# Patient Record
Sex: Female | Born: 1935 | Race: White | Hispanic: No
Health system: Southern US, Community
[De-identification: ages and names within clinical notes are randomized; demographics above are authoritative.]

---

## 2004-09-23 ENCOUNTER — Ambulatory Visit: Payer: Self-pay | Admitting: General Surgery

## 2004-12-07 ENCOUNTER — Inpatient Hospital Stay: Payer: Self-pay | Admitting: Cardiology

## 2004-12-07 ENCOUNTER — Other Ambulatory Visit: Payer: Self-pay

## 2004-12-08 ENCOUNTER — Other Ambulatory Visit: Payer: Self-pay

## 2004-12-18 ENCOUNTER — Encounter: Payer: Self-pay | Admitting: Internal Medicine

## 2004-12-23 ENCOUNTER — Ambulatory Visit: Payer: Self-pay

## 2004-12-26 ENCOUNTER — Inpatient Hospital Stay: Payer: Self-pay | Admitting: Internal Medicine

## 2004-12-26 ENCOUNTER — Other Ambulatory Visit: Payer: Self-pay

## 2004-12-31 ENCOUNTER — Encounter: Payer: Self-pay | Admitting: Internal Medicine

## 2005-03-17 ENCOUNTER — Ambulatory Visit: Payer: Self-pay | Admitting: Internal Medicine

## 2005-04-29 ENCOUNTER — Emergency Department: Payer: Self-pay | Admitting: Emergency Medicine

## 2005-05-06 ENCOUNTER — Ambulatory Visit: Payer: Self-pay | Admitting: Internal Medicine

## 2005-06-01 ENCOUNTER — Ambulatory Visit: Payer: Self-pay | Admitting: Urology

## 2005-09-28 ENCOUNTER — Ambulatory Visit: Payer: Self-pay | Admitting: General Surgery

## 2005-12-14 ENCOUNTER — Ambulatory Visit: Payer: Self-pay | Admitting: Cardiovascular Disease

## 2006-02-13 ENCOUNTER — Emergency Department: Payer: Self-pay | Admitting: Emergency Medicine

## 2006-02-13 ENCOUNTER — Other Ambulatory Visit: Payer: Self-pay

## 2006-09-29 ENCOUNTER — Ambulatory Visit: Payer: Self-pay | Admitting: General Surgery

## 2007-03-01 ENCOUNTER — Ambulatory Visit: Payer: Self-pay | Admitting: Nephrology

## 2007-11-08 ENCOUNTER — Ambulatory Visit: Payer: Self-pay | Admitting: General Surgery

## 2007-11-12 ENCOUNTER — Emergency Department: Payer: Self-pay | Admitting: Emergency Medicine

## 2007-11-20 ENCOUNTER — Ambulatory Visit: Payer: Self-pay | Admitting: General Surgery

## 2007-11-22 ENCOUNTER — Encounter: Payer: Self-pay | Admitting: Internal Medicine

## 2007-11-24 ENCOUNTER — Encounter: Payer: Self-pay | Admitting: Internal Medicine

## 2007-12-06 ENCOUNTER — Ambulatory Visit: Payer: Self-pay | Admitting: Rheumatology

## 2007-12-25 ENCOUNTER — Encounter: Payer: Self-pay | Admitting: Internal Medicine

## 2008-01-24 ENCOUNTER — Encounter: Payer: Self-pay | Admitting: Internal Medicine

## 2008-05-09 ENCOUNTER — Ambulatory Visit: Payer: Self-pay | Admitting: Gastroenterology

## 2008-11-25 ENCOUNTER — Ambulatory Visit: Payer: Self-pay | Admitting: General Surgery

## 2009-12-01 ENCOUNTER — Ambulatory Visit: Payer: Self-pay | Admitting: General Surgery

## 2010-12-03 ENCOUNTER — Ambulatory Visit: Payer: Self-pay | Admitting: General Surgery

## 2011-05-14 ENCOUNTER — Ambulatory Visit: Payer: Self-pay | Admitting: Ophthalmology

## 2011-12-21 ENCOUNTER — Ambulatory Visit: Payer: Self-pay | Admitting: Internal Medicine

## 2013-09-23 ENCOUNTER — Ambulatory Visit: Payer: Self-pay | Admitting: Hematology and Oncology

## 2013-10-04 ENCOUNTER — Emergency Department: Payer: Self-pay | Admitting: Emergency Medicine

## 2013-10-04 LAB — COMPREHENSIVE METABOLIC PANEL
ALK PHOS: 64 U/L
Albumin: 2.5 g/dL — ABNORMAL LOW (ref 3.4–5.0)
Anion Gap: 6 — ABNORMAL LOW (ref 7–16)
BUN: 14 mg/dL (ref 7–18)
Bilirubin,Total: 0.5 mg/dL (ref 0.2–1.0)
CHLORIDE: 105 mmol/L (ref 98–107)
CO2: 26 mmol/L (ref 21–32)
Calcium, Total: 8.5 mg/dL (ref 8.5–10.1)
Creatinine: 1.2 mg/dL (ref 0.60–1.30)
EGFR (African American): 49 — ABNORMAL LOW
GFR CALC NON AF AMER: 43 — AB
Glucose: 93 mg/dL (ref 65–99)
Osmolality: 274 (ref 275–301)
POTASSIUM: 3.9 mmol/L (ref 3.5–5.1)
SGOT(AST): 26 U/L (ref 15–37)
SGPT (ALT): 17 U/L (ref 12–78)
SODIUM: 137 mmol/L (ref 136–145)
Total Protein: 7.1 g/dL (ref 6.4–8.2)

## 2013-10-04 LAB — RAPID INFLUENZA A&B ANTIGENS (ARMC ONLY)

## 2013-10-04 LAB — URINALYSIS, COMPLETE
Bacteria: NONE SEEN
Bilirubin,UR: NEGATIVE
GLUCOSE, UR: NEGATIVE mg/dL (ref 0–75)
KETONE: NEGATIVE
LEUKOCYTE ESTERASE: NEGATIVE
NITRITE: NEGATIVE
PROTEIN: NEGATIVE
Ph: 6 (ref 4.5–8.0)
SPECIFIC GRAVITY: 1.009 (ref 1.003–1.030)
SQUAMOUS EPITHELIAL: NONE SEEN
WBC UR: <1 /HPF (ref 0–5)

## 2013-10-04 LAB — CBC
HCT: 36.7 % (ref 35.0–47.0)
HGB: 12.3 g/dL (ref 12.0–16.0)
MCH: 41 pg — ABNORMAL HIGH (ref 26.0–34.0)
MCHC: 33.6 g/dL (ref 32.0–36.0)
MCV: 122 fL — ABNORMAL HIGH (ref 80–100)
Platelet: 229 10*3/uL (ref 150–440)
RBC: 3.01 10*6/uL — AB (ref 3.80–5.20)
RDW: 16.5 % — ABNORMAL HIGH (ref 11.5–14.5)
WBC: 15.2 10*3/uL — ABNORMAL HIGH (ref 3.6–11.0)

## 2013-10-04 LAB — TROPONIN I

## 2013-10-08 ENCOUNTER — Ambulatory Visit: Payer: Self-pay | Admitting: Hematology and Oncology

## 2013-10-10 ENCOUNTER — Ambulatory Visit: Payer: Self-pay | Admitting: Hematology and Oncology

## 2013-10-18 ENCOUNTER — Ambulatory Visit: Payer: Self-pay | Admitting: Hematology and Oncology

## 2013-10-18 LAB — CBC WITH DIFFERENTIAL/PLATELET
BASOS PCT: 0.2 %
Basophil #: 0 10*3/uL (ref 0.0–0.1)
Eosinophil #: 0.3 10*3/uL (ref 0.0–0.7)
Eosinophil %: 2.7 %
HCT: 37.8 % (ref 35.0–47.0)
HGB: 12.5 g/dL (ref 12.0–16.0)
LYMPHS ABS: 1.6 10*3/uL (ref 1.0–3.6)
Lymphocyte %: 12.5 %
MCH: 40.6 pg — ABNORMAL HIGH (ref 26.0–34.0)
MCHC: 32.9 g/dL (ref 32.0–36.0)
MCV: 123 fL — ABNORMAL HIGH (ref 80–100)
MONOS PCT: 5.2 %
Monocyte #: 0.7 x10 3/mm (ref 0.2–0.9)
NEUTROS ABS: 10.3 10*3/uL — AB (ref 1.4–6.5)
NEUTROS PCT: 79.4 %
Platelet: 222 10*3/uL (ref 150–440)
RBC: 3.07 10*6/uL — AB (ref 3.80–5.20)
RDW: 17.1 % — ABNORMAL HIGH (ref 11.5–14.5)
WBC: 13 10*3/uL — AB (ref 3.6–11.0)

## 2013-10-18 LAB — PROTIME-INR
INR: 1.1
Prothrombin Time: 14 secs (ref 11.5–14.7)

## 2013-10-18 LAB — APTT: ACTIVATED PTT: 35.5 s (ref 23.6–35.9)

## 2013-10-19 LAB — PATHOLOGY REPORT

## 2013-10-24 ENCOUNTER — Ambulatory Visit: Payer: Self-pay | Admitting: Hematology and Oncology

## 2013-11-08 LAB — CBC CANCER CENTER
BASOS PCT: 0.1 %
Basophil #: 0 x10 3/mm (ref 0.0–0.1)
Eosinophil #: 0.3 x10 3/mm (ref 0.0–0.7)
Eosinophil %: 1.4 %
HCT: 36 % (ref 35.0–47.0)
HGB: 11.7 g/dL — ABNORMAL LOW (ref 12.0–16.0)
Lymphocyte #: 1 x10 3/mm (ref 1.0–3.6)
Lymphocyte %: 5.2 %
MCH: 40.5 pg — ABNORMAL HIGH (ref 26.0–34.0)
MCHC: 32.6 g/dL (ref 32.0–36.0)
MCV: 124 fL — AB (ref 80–100)
Monocyte #: 1 x10 3/mm — ABNORMAL HIGH (ref 0.2–0.9)
Monocyte %: 5.1 %
NEUTROS PCT: 88.2 %
Neutrophil #: 16.8 x10 3/mm — ABNORMAL HIGH (ref 1.4–6.5)
PLATELETS: 254 x10 3/mm (ref 150–440)
RBC: 2.89 10*6/uL — ABNORMAL LOW (ref 3.80–5.20)
RDW: 17.3 % — ABNORMAL HIGH (ref 11.5–14.5)
WBC: 19.1 x10 3/mm — ABNORMAL HIGH (ref 3.6–11.0)

## 2013-11-15 LAB — CBC CANCER CENTER
BASOS PCT: 1.1 %
Basophil #: 0.2 x10 3/mm — ABNORMAL HIGH (ref 0.0–0.1)
EOS ABS: 0.3 x10 3/mm (ref 0.0–0.7)
Eosinophil %: 1.7 %
HCT: 36.7 % (ref 35.0–47.0)
HGB: 12 g/dL (ref 12.0–16.0)
LYMPHS ABS: 1.1 x10 3/mm (ref 1.0–3.6)
Lymphocyte %: 7 %
MCH: 40.9 pg — ABNORMAL HIGH (ref 26.0–34.0)
MCHC: 32.8 g/dL (ref 32.0–36.0)
MCV: 125 fL — ABNORMAL HIGH (ref 80–100)
Monocyte #: 0.9 x10 3/mm (ref 0.2–0.9)
Monocyte %: 6.2 %
NEUTROS PCT: 84 %
Neutrophil #: 12.8 x10 3/mm — ABNORMAL HIGH (ref 1.4–6.5)
Platelet: 303 x10 3/mm (ref 150–440)
RBC: 2.94 10*6/uL — AB (ref 3.80–5.20)
RDW: 17.4 % — ABNORMAL HIGH (ref 11.5–14.5)
WBC: 15.2 x10 3/mm — AB (ref 3.6–11.0)

## 2013-11-22 LAB — CBC CANCER CENTER
BASOS ABS: 0.2 x10 3/mm — AB (ref 0.0–0.1)
Basophil %: 0.9 %
Eosinophil #: 0.2 x10 3/mm (ref 0.0–0.7)
Eosinophil %: 1 %
HCT: 36.4 % (ref 35.0–47.0)
HGB: 12.1 g/dL (ref 12.0–16.0)
LYMPHS PCT: 6.1 %
Lymphocyte #: 1.1 x10 3/mm (ref 1.0–3.6)
MCH: 41.1 pg — ABNORMAL HIGH (ref 26.0–34.0)
MCHC: 33.2 g/dL (ref 32.0–36.0)
MCV: 124 fL — AB (ref 80–100)
MONO ABS: 0.8 x10 3/mm (ref 0.2–0.9)
Monocyte %: 4.5 %
NEUTROS PCT: 87.5 %
Neutrophil #: 15.6 x10 3/mm — ABNORMAL HIGH (ref 1.4–6.5)
Platelet: 282 x10 3/mm (ref 150–440)
RBC: 2.94 10*6/uL — ABNORMAL LOW (ref 3.80–5.20)
RDW: 16.8 % — AB (ref 11.5–14.5)
WBC: 17.8 x10 3/mm — ABNORMAL HIGH (ref 3.6–11.0)

## 2013-11-23 ENCOUNTER — Ambulatory Visit: Payer: Self-pay | Admitting: Hematology and Oncology

## 2013-11-28 ENCOUNTER — Emergency Department: Payer: Self-pay | Admitting: Emergency Medicine

## 2013-11-28 LAB — COMPREHENSIVE METABOLIC PANEL
ALBUMIN: 2.6 g/dL — AB (ref 3.4–5.0)
ANION GAP: 7 (ref 7–16)
Alkaline Phosphatase: 68 U/L
BUN: 21 mg/dL — AB (ref 7–18)
Bilirubin,Total: 0.5 mg/dL (ref 0.2–1.0)
CO2: 27 mmol/L (ref 21–32)
CREATININE: 1.21 mg/dL (ref 0.60–1.30)
Calcium, Total: 9 mg/dL (ref 8.5–10.1)
Chloride: 104 mmol/L (ref 98–107)
EGFR (African American): 49 — ABNORMAL LOW
EGFR (Non-African Amer.): 42 — ABNORMAL LOW
GLUCOSE: 193 mg/dL — AB (ref 65–99)
Osmolality: 284 (ref 275–301)
Potassium: 4.2 mmol/L (ref 3.5–5.1)
SGOT(AST): 31 U/L (ref 15–37)
SGPT (ALT): 23 U/L (ref 12–78)
Sodium: 138 mmol/L (ref 136–145)
Total Protein: 6.9 g/dL (ref 6.4–8.2)

## 2013-11-28 LAB — CK TOTAL AND CKMB (NOT AT ARMC)
CK, TOTAL: 20 U/L — AB
CK-MB: 0.6 ng/mL (ref 0.5–3.6)

## 2013-11-28 LAB — CBC
HCT: 34.7 % — AB (ref 35.0–47.0)
HGB: 11.6 g/dL — ABNORMAL LOW (ref 12.0–16.0)
MCH: 42.4 pg — AB (ref 26.0–34.0)
MCHC: 33.5 g/dL (ref 32.0–36.0)
MCV: 126 fL — ABNORMAL HIGH (ref 80–100)
Platelet: 225 10*3/uL (ref 150–440)
RBC: 2.75 10*6/uL — AB (ref 3.80–5.20)
RDW: 17.3 % — ABNORMAL HIGH (ref 11.5–14.5)
WBC: 13.3 10*3/uL — AB (ref 3.6–11.0)

## 2013-11-28 LAB — PRO B NATRIURETIC PEPTIDE: B-TYPE NATIURETIC PEPTID: 1630 pg/mL — AB (ref 0–450)

## 2013-11-28 LAB — TROPONIN I

## 2013-12-05 LAB — COMPREHENSIVE METABOLIC PANEL
ALT: 19 U/L (ref 12–78)
Albumin: 2.5 g/dL — ABNORMAL LOW (ref 3.4–5.0)
Alkaline Phosphatase: 73 U/L
Anion Gap: 6 — ABNORMAL LOW (ref 7–16)
BILIRUBIN TOTAL: 0.7 mg/dL (ref 0.2–1.0)
BUN: 19 mg/dL — AB (ref 7–18)
CO2: 29 mmol/L (ref 21–32)
CREATININE: 1.1 mg/dL (ref 0.60–1.30)
Calcium, Total: 9 mg/dL (ref 8.5–10.1)
Chloride: 103 mmol/L (ref 98–107)
EGFR (African American): 55 — ABNORMAL LOW
EGFR (Non-African Amer.): 47 — ABNORMAL LOW
Glucose: 215 mg/dL — ABNORMAL HIGH (ref 65–99)
Osmolality: 284 (ref 275–301)
Potassium: 4.9 mmol/L (ref 3.5–5.1)
SGOT(AST): 20 U/L (ref 15–37)
SODIUM: 138 mmol/L (ref 136–145)
TOTAL PROTEIN: 7 g/dL (ref 6.4–8.2)

## 2013-12-05 LAB — CBC CANCER CENTER
Basophil #: 0.1 x10 3/mm (ref 0.0–0.1)
Basophil %: 0.7 %
Eosinophil #: 0.1 x10 3/mm (ref 0.0–0.7)
Eosinophil %: 0.8 %
HCT: 35.5 % (ref 35.0–47.0)
HGB: 11.8 g/dL — AB (ref 12.0–16.0)
LYMPHS ABS: 0.6 x10 3/mm — AB (ref 1.0–3.6)
Lymphocyte %: 3.8 %
MCH: 41.6 pg — AB (ref 26.0–34.0)
MCHC: 33.1 g/dL (ref 32.0–36.0)
MCV: 126 fL — ABNORMAL HIGH (ref 80–100)
MONOS PCT: 3.8 %
Monocyte #: 0.6 x10 3/mm (ref 0.2–0.9)
Neutrophil #: 14.1 x10 3/mm — ABNORMAL HIGH (ref 1.4–6.5)
Neutrophil %: 90.9 %
PLATELETS: 219 x10 3/mm (ref 150–440)
RBC: 2.83 10*6/uL — AB (ref 3.80–5.20)
RDW: 17.2 % — ABNORMAL HIGH (ref 11.5–14.5)
WBC: 15.5 x10 3/mm — ABNORMAL HIGH (ref 3.6–11.0)

## 2013-12-24 ENCOUNTER — Ambulatory Visit: Payer: Self-pay | Admitting: Hematology and Oncology

## 2014-01-04 LAB — COMPREHENSIVE METABOLIC PANEL
ALT: 15 U/L (ref 12–78)
ANION GAP: 7 (ref 7–16)
AST: 15 U/L (ref 15–37)
Albumin: 2.5 g/dL — ABNORMAL LOW (ref 3.4–5.0)
Alkaline Phosphatase: 73 U/L
BILIRUBIN TOTAL: 0.9 mg/dL (ref 0.2–1.0)
BUN: 16 mg/dL (ref 7–18)
Calcium, Total: 8.9 mg/dL (ref 8.5–10.1)
Chloride: 111 mmol/L — ABNORMAL HIGH (ref 98–107)
Co2: 29 mmol/L (ref 21–32)
Creatinine: 1.08 mg/dL (ref 0.60–1.30)
EGFR (Non-African Amer.): 48 — ABNORMAL LOW
GFR CALC AF AMER: 56 — AB
Glucose: 106 mg/dL — ABNORMAL HIGH (ref 65–99)
Osmolality: 294 (ref 275–301)
Potassium: 4.3 mmol/L (ref 3.5–5.1)
Sodium: 147 mmol/L — ABNORMAL HIGH (ref 136–145)
TOTAL PROTEIN: 6.9 g/dL (ref 6.4–8.2)

## 2014-01-04 LAB — CBC CANCER CENTER
BASOS PCT: 0.5 %
Basophil #: 0.1 x10 3/mm (ref 0.0–0.1)
EOS ABS: 0.1 x10 3/mm (ref 0.0–0.7)
EOS PCT: 0.5 %
HCT: 35.4 % (ref 35.0–47.0)
HGB: 11.7 g/dL — AB (ref 12.0–16.0)
Lymphocyte #: 0.5 x10 3/mm — ABNORMAL LOW (ref 1.0–3.6)
Lymphocyte %: 2.4 %
MCH: 41.9 pg — ABNORMAL HIGH (ref 26.0–34.0)
MCHC: 33.1 g/dL (ref 32.0–36.0)
MCV: 127 fL — ABNORMAL HIGH (ref 80–100)
Monocyte #: 1 x10 3/mm — ABNORMAL HIGH (ref 0.2–0.9)
Monocyte %: 4.7 %
NEUTROS PCT: 91.9 %
Neutrophil #: 19.5 x10 3/mm — ABNORMAL HIGH (ref 1.4–6.5)
Platelet: 205 x10 3/mm (ref 150–440)
RBC: 2.8 10*6/uL — ABNORMAL LOW (ref 3.80–5.20)
RDW: 16.4 % — ABNORMAL HIGH (ref 11.5–14.5)
WBC: 21.2 x10 3/mm — AB (ref 3.6–11.0)

## 2014-01-23 ENCOUNTER — Ambulatory Visit: Payer: Self-pay | Admitting: Hematology and Oncology

## 2014-01-23 DEATH — deceased

## 2014-11-16 NOTE — Consult Note (Signed)
Reason for Visit: This 81 year old Female Gray presents to the clinic for initial evaluation of  lung cancer .   Referred by Dr. Sherrlyn Hock.  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 PET CT scan confirmed hypermetabolic necrotic tumor in the right upper lobe compatible with primary bronchogenic carcinoma. There was also a 7 mm satellite nodule in the anterior right upper lobe. She also had hypermetabolic no metastasis in the azygos azygos esophageal recess and right diaphragmatic recess.biopsy was performed CT guided core biopsy showing non-small cell lung cancer. She seen by medical oncology and is now referred to radiation oncology for consideration of palliative treatment. She is doing fairly well at this timealthough in considerable pain in the right anterir chest with tumor invading the rib.Gray's husband was a former Gray of mine approximately 10 years prior  Past Hx:    Lung Cancer:    anemia: 2006   Kidney problems:    Lower extremity vasculitis:    copd:    irregular heart rate:    Cardiac Cath:    Kidney Biopsy:    Breast Biopsy:    Cataract Extraction:    D&C - Dilation and Curretage:   Past, Family and Social History:  Past Medical History positive   Cardiovascular cardiac catheterization; irregular heart rate   Respiratory COPD   Past Surgical History cardiac surgery   Past Medical History  Comments lower extremity vasculitis, anemia and anemia   Family History noncontributory   Social History noncontributory   Additional Past Medical and Surgical History accompanied by son today   Allergies:   Adhesive: Rash  Contrast dye: Other  Lipitor: Unknown  Zocor: Unknown  plaguexel: Unknown  Nursing Notes:  Nursing Vital Signs and Chemo Nursing Nursing Notes: *CC Vital Signs Flowsheet:   31-Mar-15 11:27  Temp Temperature 99  Pulse Pulse 95  Respirations Respirations 20  SBP SBP 108  DBP DBP 60  Pain Scale (0-10)  5-6  Pulse Oxi  96  Current Weight (kg) (kg) 53.1  Height (cm) centimeters 160  BSA (m2) 1.5   Physical Exam:  General/Skin/HEENT:  General normal   Skin normal   Eyes normal   ENMT normal   Head and Neck normal   Additional PE a well-developed elderly female wheelchair-bound in NAD. Lungs are clear to A&P current examination shows regular rate and rhythm. There is a firm mass of the right anterior chest wall corresponding to the area of known tumor involvement. No pain is elicited on range of motion of her upper extremities. Motor sensory and DTR levels are equal and symmetric in upper and lower extremities.   Breasts/Resp/CV/GI/GU:  Respiratory and Thorax normal   Cardiovascular normal   Gastrointestinal normal   Genitourinary normal   MS/Neuro/Psych/Lymph:  Musculoskeletal normal   Neurological normal   Lymphatics normal   Other Results:  Radiology Results: LabUnknown:    18-Mar-15 11:48, PET/CT Scan Lung Cancer Initial  Staging  PACS Image   Nuclear Med:  PET/CT Scan Lung Cancer Initial Staging   REASON FOR EXAM:    Lung Ca  COMMENTS:       PROCEDURE: PET - PET/CT INIT STAGING LUNG CA  - Oct 10 2013 11:48AM     CLINICAL DATA:  Initial treatment strategy for right lung cancer.    EXAM:  NUCLEAR MEDICINE PET SKULL BASE TO THIGH    TECHNIQUE:  12.15 mCi F-18 FDG was injected intravenously. Full-ring PET imaging  was  performed from the skull base to thigh after the radiotracer. CT  data was obtained and used for attenuation correction and anatomic  localization.  FASTING BLOOD GLUCOSE:  Value: 78 mg/dl    COMPARISON:  CT chest dated 10/04/2013    FINDINGS:  NECK    No hypermetabolic lymph nodes in the neck.    4 mm short axis lymph node in the posterior right parotid gland  (series 3/ image 19). Possible mild associated asymmetric  hypermetabolism involving the right parotid gland, max SUV 4.5.    Asymmetric muscular uptake in the posterior neck and left posterior  chest/shoulder region.  CHEST    4.1 x 4.6 cm necrotic anterior right upper lobe mass (series 3/  image 61), max SUV 35.8, compatible with primary bronchogenic  neoplasm. Associated chest wall involvement with destruction of the  overlying right anterior 1st rib (series 3/ image 53).    7 mm subpleural satellite nodule in the anterior right upper lobe  (series 3/image 69), max SUV 5.5.    Underlying mild to moderate centrilobular and paraseptal  emphysematous changes. Biapical pleural parenchymal scarring, right  greater than left. Trace right pleural effusion, possibly malignant.  No pneumothorax.  Thoracic lymphadenopathy, including:    --12 mm short axis right paratracheal/precarinal node (series  3/image 65), non FDG avid    --11 mm short axis subcarinal node (series 3/image 71), essentially  non FDG avid    --9 mm short axis node in the azygoesophageal recess (series 3/image  78), max SUV 5.5    --Two right juxtadiaphragmatic nodes measuring 11 an 8 mm short axis  (series 3/images 92 and 95), max SUV 12.9    Heart is normal in size. Coronary atherosclerosis. Atherosclerotic  calcifications of the aorticarch. 3.4 cm lower thoracic aortic  aneurysm (series 3/image 98).    ABDOMEN/PELVIS    No abnormal hypermetabolic activity within the liver, pancreas,  adrenal glands, or spleen.    10 mm left adrenal nodule  (series 3/image 122), technically  indeterminate on CT, although non FDG avid (max SUV 2.8) and favored  to reflect a benign adrenal adenoma.    No hypermetabolic lymph nodes in the abdomen or pelvis.    Layering gallstones (series 3/ image 132). Bilateral renal cysts,  measuring up to 6.0 cmin the right upper pole (series 3/ image 124)  and 2.4 cm in the left upper pole (series 3/ image 127).  Nonobstructing bilateral renal calculi measuring 5 mm in the right  upper pole (series 3/ image 123) and a 3 mm in the left lower pole  (series 3/ image 142).    Atherosclerotic calcifications of the abdominal aorta and branch  vessels. 3.2 x 3.2 cm infrarenal abdominal aortic aneurysm just  above the aortic bifurcation (series 3/image 159).    SKELETON    No focal hypermetabolic activity to suggest skeletal metastasis.     IMPRESSION:  4.6 cm hypermetabolic, necrotic anterior right upper lobe mass,  compatible with primary bronchogenic neoplasm. Additional  hypermetabolic 7 mm satellite nodule in the anterior right upper  lobe.    Associated involvement of the chest wall/right anterior 1st rib.  Trace right pleural effusion, possibly malignant.    Hypermetabolic nodal metastases in the azygoesophageal recess and  right juxtadiaphragmatic region.    Suspected 10 mm left adrenal adenoma, non FDG avid.    Mild asymmetric hypermetabolism involving the right posterior  parotid gland, with possible 4 mm short axis intraparotid lymph  node. This appearance is considered unlikely to reflect metastatic  disease, although attention on follow-up is suggested.    Additional ancillary findings as above.      Electronically Signed    By: Charline BillsSriyesh  Krishnan M.D.    On: 10/10/2013 14:Kimberly         Verified By: Charline BillsSRIYESH . KRISHNAN, M.D.,   Relevent Results:   Relevant Scans and Labs PET CT scan is reviewed   Assessment and Plan: Impression:   locally advanced non-small cell lung cancer of  the right upper lobe with rib and chest wall involvement for palliative radiation therapy in 81 year old female Plan:   I discussed the case personally withDr. Sherrlyn HockPandit. We'll plan to deliver 4000 cGy over 4 weeks to a right upper lobe lesion and evaluate for response. Risks and benefits of treatment including skin reaction, cough, possible dysphasia, alteration blood counts all were explained in detail to the Gray and her son. Both seem to comprehend my treatment plan well I sent her for CT simulation tomorrow. At completion of palliative radiation therapy she may be a candidate for systemic chemotherapy we will leave that up to medical oncology.  I would like to take this opportunity for allowing me to participate in the care of your Gray..  CC Referral:  cc: Dr. Dario GuardianJadali   Electronic Signatures: Rushie Chestnuthrystal, Gordy CouncilmanGlenn S (MD)  (Signed 31-Mar-15 15:28)  Authored: HPI, Diagnosis, Past Hx, PFSH, Allergies, Nursing Notes, Physical Exam, Other Results, Relevent Results, Encounter Assessment and Plan, CC Referring Physician   Last Updated: 31-Mar-15 15:28 by Rebeca Alerthrystal, Anelly Samarin S (MD)

## 2015-05-24 IMAGING — CT CT CHEST W/O CM
2 of 3 series · 14 of 36 positions shown, 17 images · non-contrast
Comparison: No priors.  Chest x-ray 10/04/2013.

CLINICAL DATA: Weakness.

EXAM:
CT CHEST WITHOUT CONTRAST
TECHNIQUE: Multidetector CT imaging of the chest was performed following the
standard protocol without IV contrast.

[Series 3: routine chest wo · axial · 0.57mm/px · z∈[+604,+859]mm · 11 of 61 slices shown, 14 images]
[im 5/61  mediastinal]
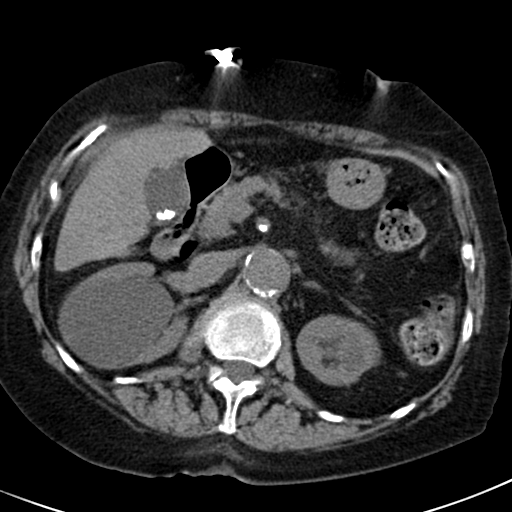
[im 5/61  lung]
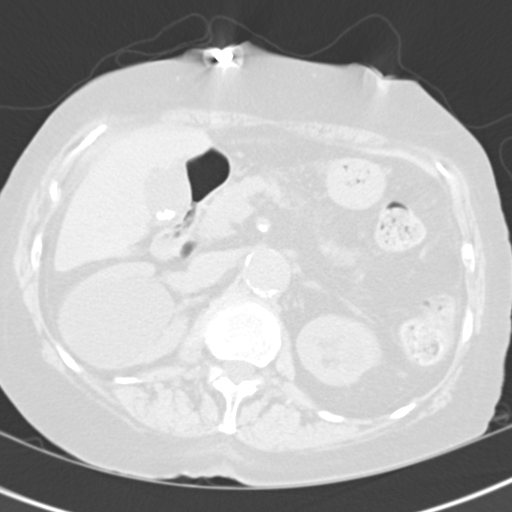
[im 9/61  lung]
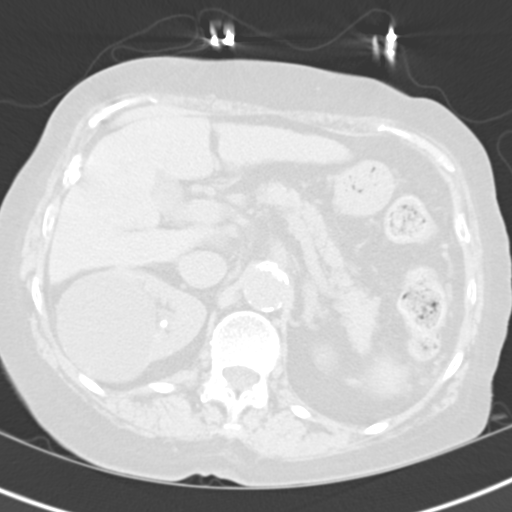
[im 14/61  lung]
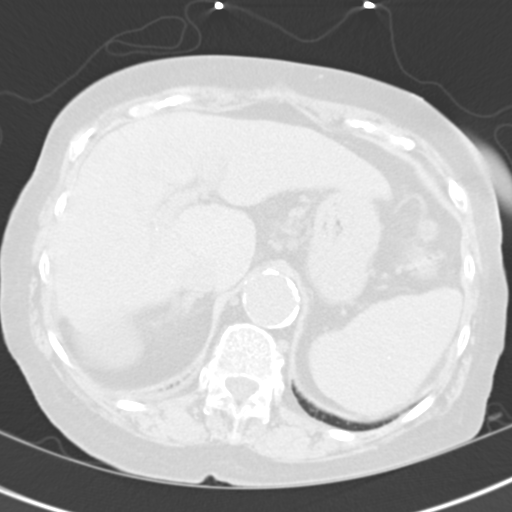
[im 21/61  lung]
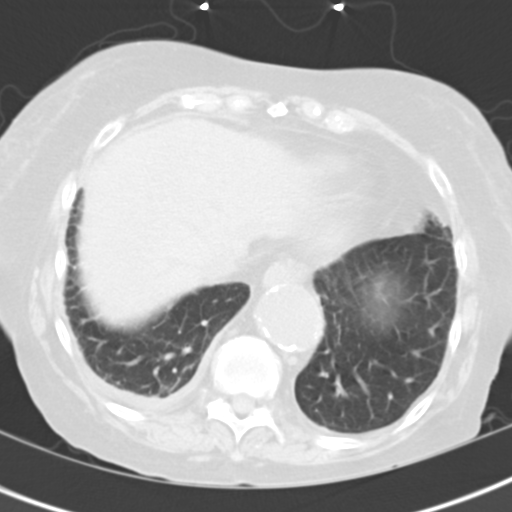
[im 25/61  mediastinal]
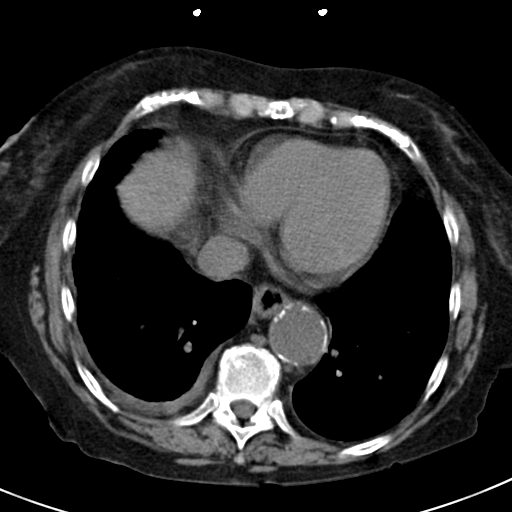
[im 25/61  lung]
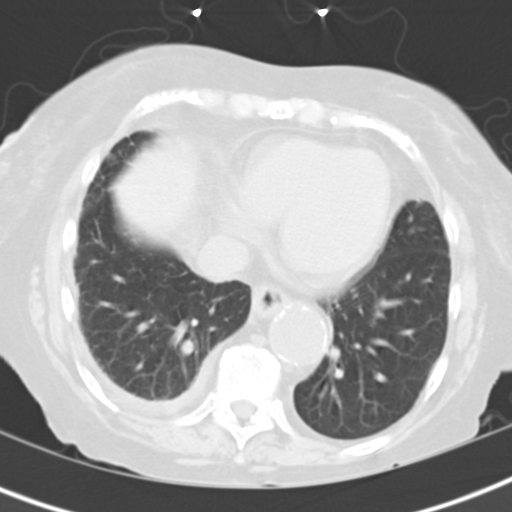
[im 32/61  lung]
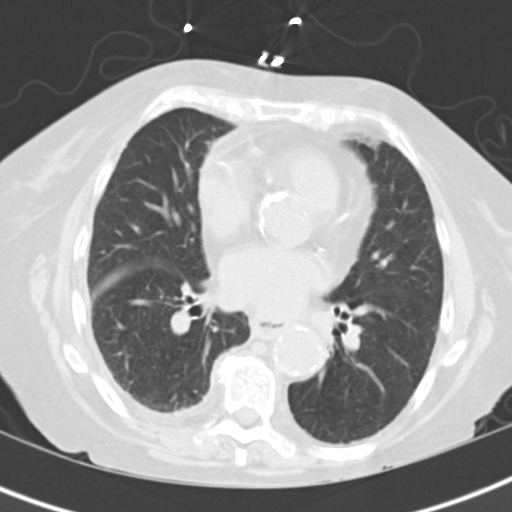
[im 36/61  lung]
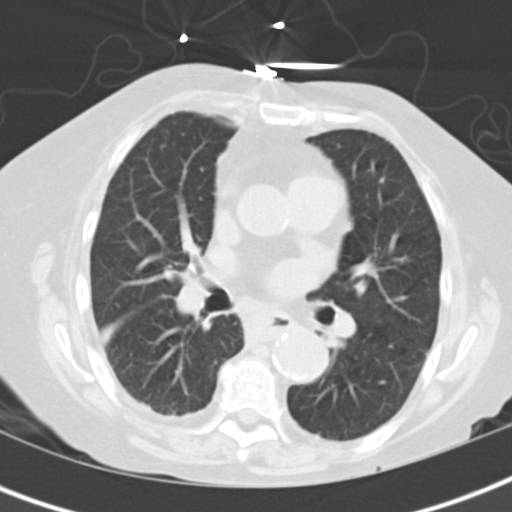
[im 41/61  lung]
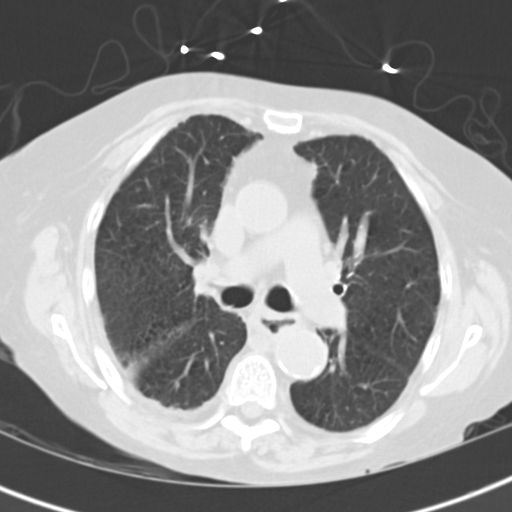
[im 47/61  mediastinal]
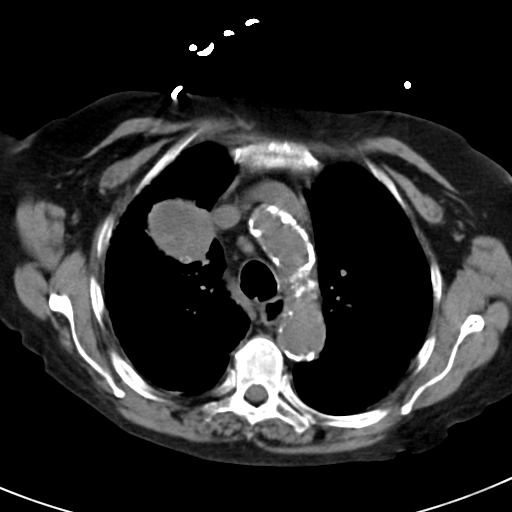
[im 47/61  lung]
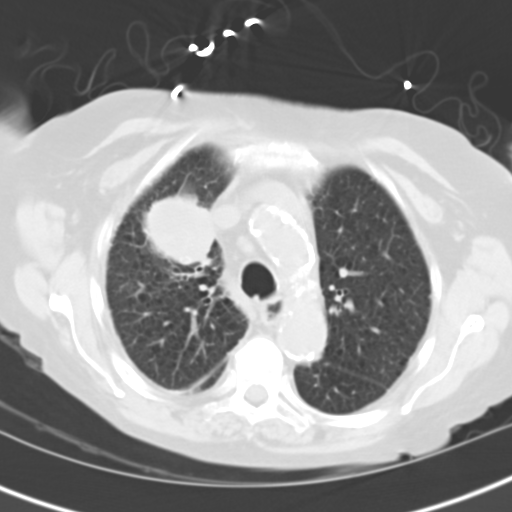
[im 52/61  lung]
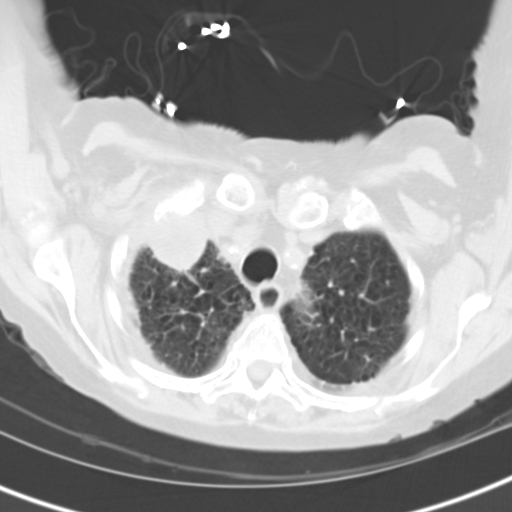
[im 56/61  lung]
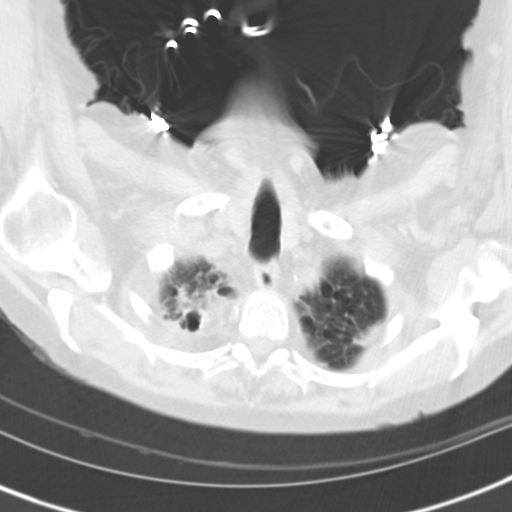

[Series 6: cor routine chest wo · coronal · 0.58mm/px · 3 of 115 slices shown]
[im 23/115  lung]
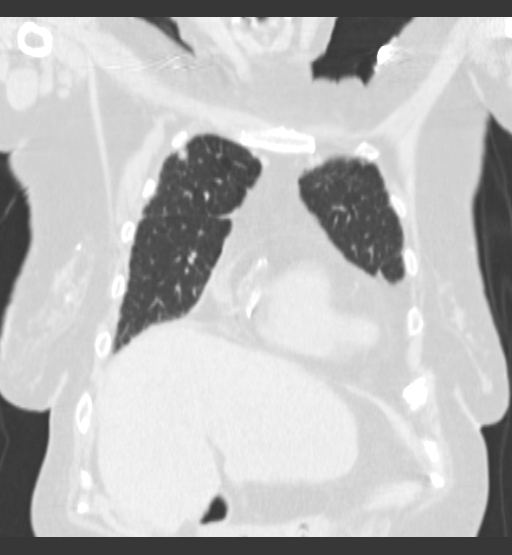
[im 46/115  lung]
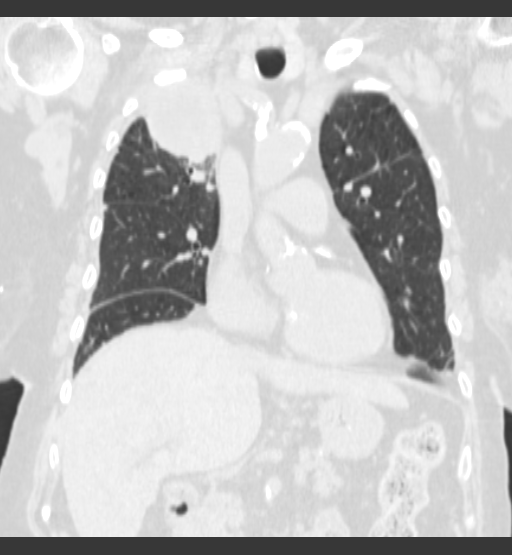
[im 69/115  lung]
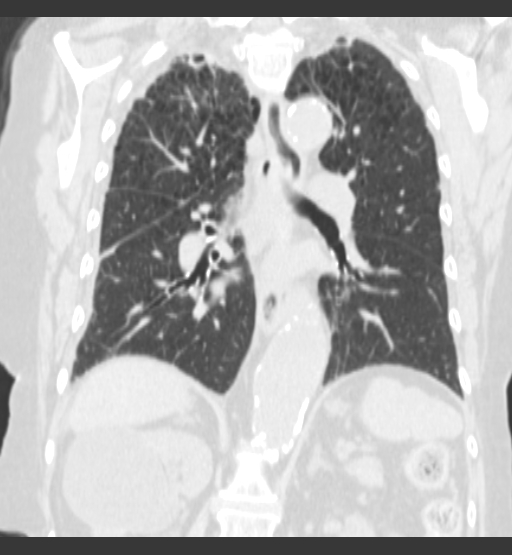

[14 of 36 positions shown; findings below may reference images not displayed]

FINDINGS: Mediastinum: Heart size is normal. There is no significant
pericardial fluid, thickening or pericardial calcification. Numerous
enlarged mediastinal lymph nodes include right juxta pericardiac
lymph nodes measuring 11 mm in short axis, subcarinal node measuring
12 mm in short axis and a lower right paratracheal lymph node
measuring up to 13 mm in short axis. Soft tissue prominence in the
right hilar region is noted, but poorly evaluated on today's non
contrast CT examination. Esophagus is patulous and thick walled, and
there is a suggestion of a large esophageal diverticulum in the mid
esophagus. There is atherosclerosis of the thoracic aorta, the great
vessels of the mediastinum and the coronary arteries, including
calcified atherosclerotic plaque in the left main, left anterior
descending, left circumflex and right coronary arteries. In
addition, there is ectasia of the descending thoracic aorta which
measures up to 3.3 cm in diameter.

Lungs/Pleura: 4.2 x 4.4 x 5.0 cm mass in the anterior aspect of the
right upper lobe makes broad contact with the overlying pleura both
medially adjacent to the mediastinum, and anteriorly. This mass
appears to have heterogeneous internal architecture with areas of
predominantly peripheral nodular enhancement, and is highly
concerning for primary bronchogenic neoplasm. There is a small
amount of pleural fluid and/or thickening on the right side. No left
pleural effusion. Mild centrilobular and paraseptal emphysema. Mild
diffuse bronchial wall thickening.

Upper Abdomen: Numerous calcified gallstones layering dependently in
the gallbladder. Large low-attenuation lesion in the upper pole of
the right kidney measuring at least 6.0 x 6.3 cm, with faint
peripheral rim of calcification superiorly, favored to represent a
minimally complex cyst. 4 mm nonobstructive calculus in the upper
pole collecting system of the right kidney. Low-attenuation lesion
in the upper pole of the left kidney is also incompletely
characterized on today's non contrast CT examination.

Musculoskeletal: There are no aggressive appearing lytic or blastic
lesions noted in the visualized portions of the skeleton.
IMPRESSION: 1. 4.2 x 4.4 x 5.0 cm right upper lobe mass with associated
mediastinal adenopathy, as detailed above, highly concerning for
primary bronchogenic neoplasm with mediastinal metastases. In
addition, there is a small amount of fluid and/or thickening in the
right pleural space, which could suggest early pleural involvement,
particularly given the broad base of pleural contact. Further
evaluation with PET-CT and/or biopsy is recommended to establish
diagnosis and for staging purposes.
2. Patulous esophagus with probable large esophageal diverticulum.
Alternatively, the possibility of a esophageal neoplasm with an
erosion is difficult to exclude. Correlation with nonemergent
endoscopy may provide additional diagnostic information if
clinically appropriate.
3. Cholelithiasis.
4. Nonobstructive calculus measuring 4 mm in the upper pole
collecting system of the right kidney.
5. Mild centrilobular and paraseptal emphysema.
6. Additional incidental findings, as above.
